# Patient Record
Sex: Female | Born: 1990 | Race: White | Hispanic: No | Marital: Single | Smoking: Never smoker
Health system: Southern US, Community
[De-identification: ages and names within clinical notes are randomized; demographics above are authoritative.]

## PROBLEM LIST (undated history)

## (undated) DIAGNOSIS — T7840XA Allergy, unspecified, initial encounter: Secondary | ICD-10-CM

## (undated) HISTORY — DX: Allergy, unspecified, initial encounter: T78.40XA

---

## 2015-02-09 ENCOUNTER — Ambulatory Visit (INDEPENDENT_AMBULATORY_CARE_PROVIDER_SITE_OTHER): Payer: 59 | Admitting: Family Medicine

## 2015-02-09 ENCOUNTER — Encounter: Payer: Self-pay | Admitting: Family Medicine

## 2015-02-09 VITALS — BP 114/96 | HR 97 | Temp 98.2°F | Resp 17 | Ht 63.5 in | Wt 281.6 lb

## 2015-02-09 DIAGNOSIS — E669 Obesity, unspecified: Secondary | ICD-10-CM

## 2015-02-09 DIAGNOSIS — A5901 Trichomonal vulvovaginitis: Secondary | ICD-10-CM | POA: Diagnosis not present

## 2015-02-09 DIAGNOSIS — N926 Irregular menstruation, unspecified: Secondary | ICD-10-CM | POA: Diagnosis not present

## 2015-02-09 DIAGNOSIS — E282 Polycystic ovarian syndrome: Secondary | ICD-10-CM | POA: Diagnosis not present

## 2015-02-09 DIAGNOSIS — Z30011 Encounter for initial prescription of contraceptive pills: Secondary | ICD-10-CM | POA: Diagnosis not present

## 2015-02-09 DIAGNOSIS — Z130 Encounter for screening for diseases of the blood and blood-forming organs and certain disorders involving the immune mechanism: Secondary | ICD-10-CM

## 2015-02-09 DIAGNOSIS — Z113 Encounter for screening for infections with a predominantly sexual mode of transmission: Secondary | ICD-10-CM

## 2015-02-09 DIAGNOSIS — Z124 Encounter for screening for malignant neoplasm of cervix: Secondary | ICD-10-CM | POA: Diagnosis not present

## 2015-02-09 DIAGNOSIS — Z13220 Encounter for screening for lipoid disorders: Secondary | ICD-10-CM

## 2015-02-09 DIAGNOSIS — J302 Other seasonal allergic rhinitis: Secondary | ICD-10-CM | POA: Diagnosis not present

## 2015-02-09 DIAGNOSIS — Z131 Encounter for screening for diabetes mellitus: Secondary | ICD-10-CM | POA: Diagnosis not present

## 2015-02-09 DIAGNOSIS — N915 Oligomenorrhea, unspecified: Secondary | ICD-10-CM

## 2015-02-09 LAB — POCT URINE PREGNANCY: PREG TEST UR: NEGATIVE

## 2015-02-09 MED ORDER — NORETHINDRONE 0.35 MG PO TABS: 1.0000 | ORAL_TABLET | Freq: Every day | ORAL | Status: AC

## 2015-02-09 MED ORDER — NORETHINDRONE 0.35 MG PO TABS: 1.0000 | ORAL_TABLET | Freq: Every day | ORAL | Status: DC

## 2015-02-09 MED ORDER — FLUTICASONE PROPIONATE 50 MCG/ACT NA SUSP: 2.0000 | Freq: Every day | NASAL | Status: DC

## 2015-02-09 MED ORDER — FLUTICASONE PROPIONATE 50 MCG/ACT NA SUSP: 2.0000 | Freq: Every day | NASAL | Status: AC

## 2015-02-09 NOTE — Patient Instructions (Addendum)
I will be in touch with your labs asap I do suspect that you have PCOS.  This can make it harder (but certainly not impossible!) to become pregnant Weight loss is your best tool to fight PCOS.  I would recommend that you work on gradual weight loss by increasing your activity level and by cutting back somewhat on portion sizes.  Weight training is key to building muscle mass and burning more calories (your might try the Body Pump and other classes offered through [employer] for free).  Weight watchers can also help.   Since you are not ready to have a baby right now we will start you on birth control pills.  We will start you on a progesterone only pill- take it once a day.   Take a home pregnancy test in about 2 weeks; assuming negative continue your pill  I agree that you are having sx of allergies.   I send in an rx for a nasal steroid spray. Your might also try an OTC antihistamine such as claritin or zyrtec.    It was nice to meet you today!  I will look for your labs when you have them draw [facility]- sorry that we were not able to get these today.

## 2015-02-09 NOTE — Progress Notes (Signed)
[facility] [address] [place] [address] [phone]  Date:  02/09/2015   Name:  Samantha Todd   DOB:  02-Aug-1990   MRN:  [id number]  PCP:  No primary care provider on file.    Chief Complaint: STD Testing; runny nose; and eyes runny   History of Present Illness:  Samantha Todd is a 24 y.o. very pleasant female patient who presents with the following:  Here today as a new patient with concern of needing STI screening, irregular menses and ?allergies Also she has noted possible allergy sx- in the morning she will have a runny nose, runny eyes.  Some sneezing- not too much. Not coughing She can have some itching of her nose and eyes This has bothered her for 3 weeks or so.  She tried an OTC nasal spray but it did not seem to help her too much Her last pap was about 2 years ago.    She and her BF are having unprotected sex- have done so for 2 years.  She has not gotten pregnant and wonders why.  Her mother did have PCOS and she wonders if she might have the same  [name] states that her menses have become irregular over the last 3 years; can occur "whenever," may last 3-4 days.   Not a smoker No history of DVT/ PE No cancer history No known history of HTN She does not get migraine HA.    She is a Water quality scientist at [employer].    There are no active problems to display for this patient.   Past Medical History  Diagnosis Date  . Allergy     History reviewed. No pertinent past surgical history.  Social History  Substance Use Topics  . Smoking status: Never Smoker   . Smokeless tobacco: None  . Alcohol Use: No    Family History  Problem Relation Age of Onset  . Hypertension Father   . Hyperlipidemia Maternal Grandmother   . Hypertension Maternal Grandmother   . Heart disease Maternal Grandfather   . Hypertension Paternal Grandmother   . Cancer Paternal Grandfather     Lung    No Known Allergies  Medication list has been reviewed  and updated.  No current outpatient prescriptions on file prior to visit.   No current facility-administered medications on file prior to visit.    Review of Systems:  As per HPI- otherwise negative.   Physical Examination: Filed Vitals:   02/09/15 1543  BP: 124/96  Pulse: 97  Temp: 98.2 F (36.8 C)  Resp: 17   Filed Vitals:   02/09/15 1543  Height: 5' 3.5" (1.613 m)  Weight: 281 lb 9.6 oz (127.733 kg)   Body mass index is 49.09 kg/(m^2). Ideal Body Weight: Weight in (lb) to have BMI = 25: 143.1  GEN: WDWN, NAD, Non-toxic, A & O x 3, obese, looks well HEENT: Atraumatic, Normocephalic. Neck supple. No masses, No LAD. Ears and Nose: No external deformity. CV: RRR, No M/G/R. No JVD. No thrill. No extra heart sounds. PULM: CTA B, no wheezes, crackles, rhonchi. No retractions. No resp. distress. No accessory muscle use. ABD: S, NT, ND EXTR: No c/c/e NEURO Normal gait.  PSYCH: Normally interactive. Conversant. Not depressed or anxious appearing.  Calm demeanor.  Pelvic: normal, no vaginal lesions or discharge. Uterus normal, no CMT, no adnexal tendereness or masses   Results for orders placed or performed in visit on 02/09/15  POCT urine pregnancy  Result Value Ref  Range   Preg Test, Ur Negative Negative    Assessment and Plan: Screening for cervical cancer - Plan: Pap IG, CT/NG w/ reflex HPV when ASC-U  Routine screening for STI (sexually transmitted infection) - Plan: Hepatitis B surface antibody, Hepatitis B surface antigen, Hepatitis C antibody, HIV antibody, RPR, CANCELED: Hepatitis B surface antibody, CANCELED: Hepatitis B surface antigen, CANCELED: Hepatitis C antibody, CANCELED: HIV antibody, CANCELED: RPR  Screening for hyperlipidemia - Plan: Lipid panel, CANCELED: LDL cholesterol, direct  Screening for diabetes mellitus - Plan: Comprehensive metabolic panel, Hemoglobin A1c, CANCELED: Comprehensive metabolic panel, CANCELED: Hemoglobin A1c  Obesity - Plan:  TSH, CANCELED: Hemoglobin A1c, CANCELED: LDL cholesterol, direct  PCOS (polycystic ovarian syndrome)  Irregular menses - Plan: POCT urine pregnancy, TSH, CANCELED: TSH  Screening for deficiency anemia - Plan: CBC, CANCELED: CBC  Other seasonal allergic rhinitis - Plan: fluticasone (FLONASE) 50 MCG/ACT nasal spray, DISCONTINUED: fluticasone (FLONASE) 50 MCG/ACT nasal spray  Encounter for initial prescription of contraceptive pills - Plan: norethindrone (ORTHO MICRONOR) 0.35 MG tablet, DISCONTINUED: norethindrone (ORTHO MICRONOR) 0.35 MG tablet  We were not able to get her blood today- ordered all BW as future orders so she can hopefully get these done at [facility] where [facility] works soon See patient instructions for more details and discussion.     Signed Abbe AmsterdamJessica Maruice Pieroni, MD

## 2015-02-10 DIAGNOSIS — E282 Polycystic ovarian syndrome: Secondary | ICD-10-CM | POA: Insufficient documentation

## 2015-02-10 DIAGNOSIS — E669 Obesity, unspecified: Secondary | ICD-10-CM | POA: Insufficient documentation

## 2015-02-11 LAB — PAP IG, CT-NG, RFX HPV ASCU
CHLAMYDIA PROBE AMP: NEGATIVE
GC PROBE AMP: NEGATIVE

## 2015-02-11 MED ORDER — METRONIDAZOLE 500 MG PO TABS: 500.0000 mg | ORAL_TABLET | Freq: Two times a day (BID) | ORAL | Status: DC

## 2015-02-11 NOTE — Addendum Note (Signed)
Addended by: Abbe AmsterdamOPLAND, JESSICA C on: 02/11/2015 08:14 PM   Modules accepted: Orders

## 2015-02-12 ENCOUNTER — Other Ambulatory Visit (INDEPENDENT_AMBULATORY_CARE_PROVIDER_SITE_OTHER): Payer: 59 | Admitting: *Deleted

## 2015-02-12 ENCOUNTER — Other Ambulatory Visit: Payer: Self-pay | Admitting: Family Medicine

## 2015-02-12 DIAGNOSIS — Z131 Encounter for screening for diabetes mellitus: Secondary | ICD-10-CM

## 2015-02-12 DIAGNOSIS — N926 Irregular menstruation, unspecified: Secondary | ICD-10-CM

## 2015-02-12 DIAGNOSIS — Z13220 Encounter for screening for lipoid disorders: Secondary | ICD-10-CM

## 2015-02-12 DIAGNOSIS — Z130 Encounter for screening for diseases of the blood and blood-forming organs and certain disorders involving the immune mechanism: Secondary | ICD-10-CM | POA: Diagnosis not present

## 2015-02-12 DIAGNOSIS — E669 Obesity, unspecified: Secondary | ICD-10-CM

## 2015-02-12 DIAGNOSIS — N915 Oligomenorrhea, unspecified: Secondary | ICD-10-CM

## 2015-02-12 DIAGNOSIS — Z113 Encounter for screening for infections with a predominantly sexual mode of transmission: Secondary | ICD-10-CM

## 2015-02-12 DIAGNOSIS — A5901 Trichomonal vulvovaginitis: Secondary | ICD-10-CM

## 2015-02-12 LAB — COMPREHENSIVE METABOLIC PANEL
ALBUMIN: 4.1 g/dL (ref 3.6–5.1)
ALT: 12 U/L (ref 6–29)
AST: 12 U/L (ref 10–30)
Alkaline Phosphatase: 56 U/L (ref 33–115)
BILIRUBIN TOTAL: 0.4 mg/dL (ref 0.2–1.2)
BUN: 11 mg/dL (ref 7–25)
CALCIUM: 8.9 mg/dL (ref 8.6–10.2)
CHLORIDE: 103 mmol/L (ref 98–110)
CO2: 28 mmol/L (ref 20–31)
Creat: 0.82 mg/dL (ref 0.50–1.10)
Glucose, Bld: 104 mg/dL — ABNORMAL HIGH (ref 65–99)
POTASSIUM: 4.1 mmol/L (ref 3.5–5.3)
SODIUM: 139 mmol/L (ref 135–146)
Total Protein: 7.6 g/dL (ref 6.1–8.1)

## 2015-02-12 LAB — HEMOGLOBIN A1C
HEMOGLOBIN A1C: 5.5 % (ref <5.7)
Mean Plasma Glucose: 111 mg/dL (ref <117)

## 2015-02-12 LAB — LIPID PANEL
CHOL/HDL RATIO: 3.5 ratio (ref <=5.0)
Cholesterol: 129 mg/dL (ref 125–200)
HDL: 37 mg/dL — AB (ref >=46)
LDL CALC: 76 mg/dL (ref <130)
Triglycerides: 78 mg/dL (ref <150)
VLDL: 16 mg/dL (ref <30)

## 2015-02-12 LAB — HIV ANTIBODY (ROUTINE TESTING W REFLEX): HIV 1&2 Ab, 4th Generation: NONREACTIVE

## 2015-02-12 LAB — CBC
HCT: 39.9 % (ref 36.0–46.0)
Hemoglobin: 13 g/dL (ref 12.0–15.0)
MCH: 26.9 pg (ref 26.0–34.0)
MCHC: 32.6 g/dL (ref 30.0–36.0)
MCV: 82.6 fL (ref 78.0–100.0)
MPV: 8.7 fL (ref 8.6–12.4)
PLATELETS: 371 10*3/uL (ref 150–400)
RBC: 4.83 MIL/uL (ref 3.87–5.11)
RDW: 12.6 % (ref 11.5–15.5)
WBC: 8.2 10*3/uL (ref 4.0–10.5)

## 2015-02-12 LAB — TSH: TSH: 1.256 u[IU]/mL (ref 0.350–4.500)

## 2015-02-12 MED ORDER — METRONIDAZOLE 500 MG PO TABS: 500.0000 mg | ORAL_TABLET | Freq: Two times a day (BID) | ORAL | Status: AC

## 2015-02-12 NOTE — Addendum Note (Signed)
Addended by: Abbe AmsterdamOPLAND, JESSICA C on: 02/12/2015 08:05 AM   Modules accepted: Orders

## 2015-02-13 ENCOUNTER — Encounter: Payer: Self-pay | Admitting: Family Medicine

## 2015-02-13 DIAGNOSIS — Z789 Other specified health status: Secondary | ICD-10-CM | POA: Insufficient documentation

## 2015-02-13 LAB — HEPATITIS C ANTIBODY: HCV AB: NEGATIVE

## 2015-02-13 LAB — HEPATITIS B SURFACE ANTIBODY, QUANTITATIVE: Hepatitis B-Post: 36.6 m[IU]/mL

## 2015-02-13 LAB — RPR

## 2015-02-13 LAB — HEPATITIS B SURFACE ANTIGEN: HEP B S AG: NEGATIVE

## 2015-02-14 ENCOUNTER — Other Ambulatory Visit: Payer: Self-pay | Admitting: Family Medicine

## 2015-02-14 DIAGNOSIS — N915 Oligomenorrhea, unspecified: Secondary | ICD-10-CM

## 2015-02-18 ENCOUNTER — Other Ambulatory Visit: Payer: Self-pay

## 2015-02-21 ENCOUNTER — Ambulatory Visit
Admission: RE | Admit: 2015-02-21 | Discharge: 2015-02-21 | Disposition: A | Discharge status: Home / Self Care | Payer: 59 | Source: Ambulatory Visit | Attending: Family Medicine | Admitting: Family Medicine

## 2015-02-21 ENCOUNTER — Ambulatory Visit
Admission: RE | Admit: 2015-02-21 | Discharge: 2015-02-21 | Disposition: A | Discharge status: Home / Self Care | Payer: Self-pay | Source: Ambulatory Visit | Attending: Family Medicine | Admitting: Family Medicine

## 2015-02-21 DIAGNOSIS — N915 Oligomenorrhea, unspecified: Secondary | ICD-10-CM

## 2016-07-20 IMAGING — US US PELVIS COMPLETE
1 series · 14 of 25 positions shown · non-contrast
Comparison: None

CLINICAL DATA: Irregular cycles, infertility. Evaluate for
polycystic ovarian syndrome.

EXAM:
TRANSABDOMINAL AND TRANSVAGINAL ULTRASOUND OF PELVIS
TECHNIQUE: Both transabdominal and transvaginal ultrasound examinations of the
pelvis were performed. Transabdominal technique was performed for
global imaging of the pelvis including uterus, ovaries, adnexal
regions, and pelvic cul-de-sac. It was necessary to proceed with
endovaginal exam following the transabdominal exam to visualize the
uterus, endometrium, ovaries and adnexa .

[Series 1: us pelvis complete · 0.30mm/px · 14 of 58 slices shown]
[im 1/58]
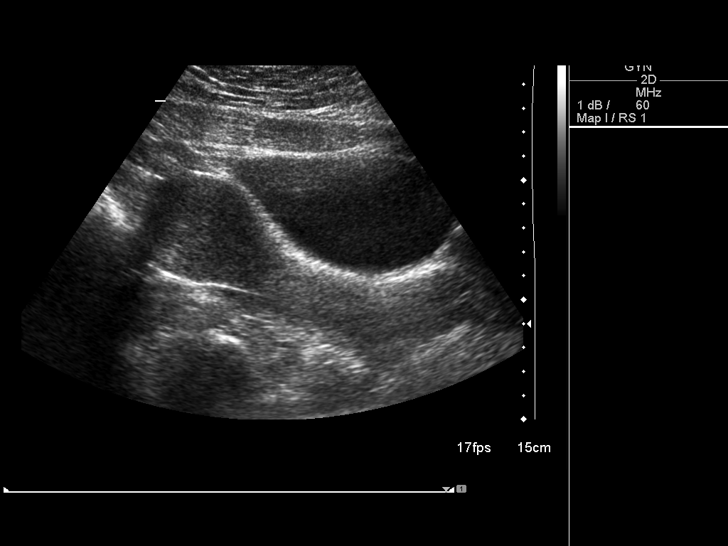
[im 5/58]
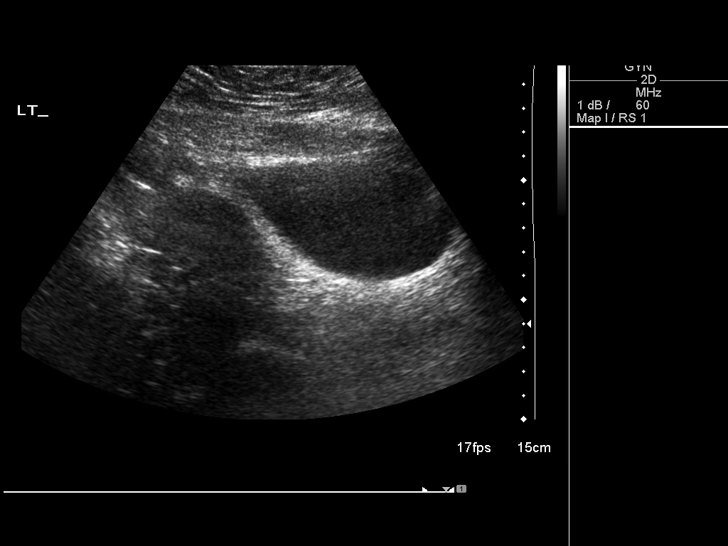
[im 10/58]
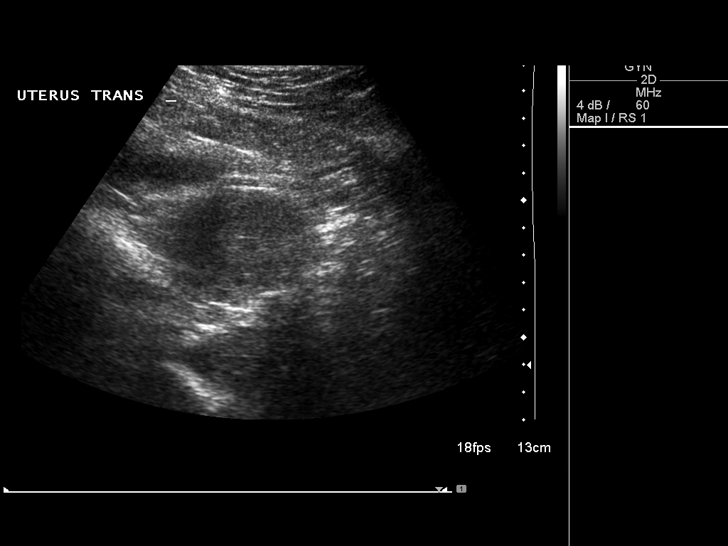
[im 15/58]
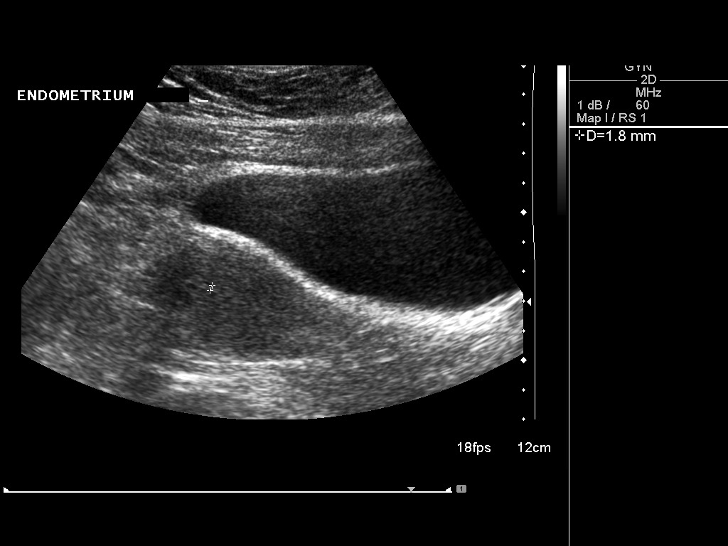
[im 20/58]
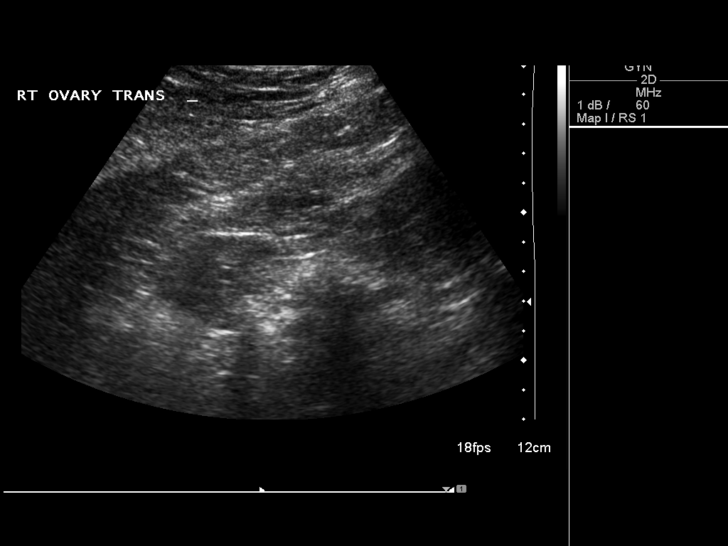
[im 22/58]
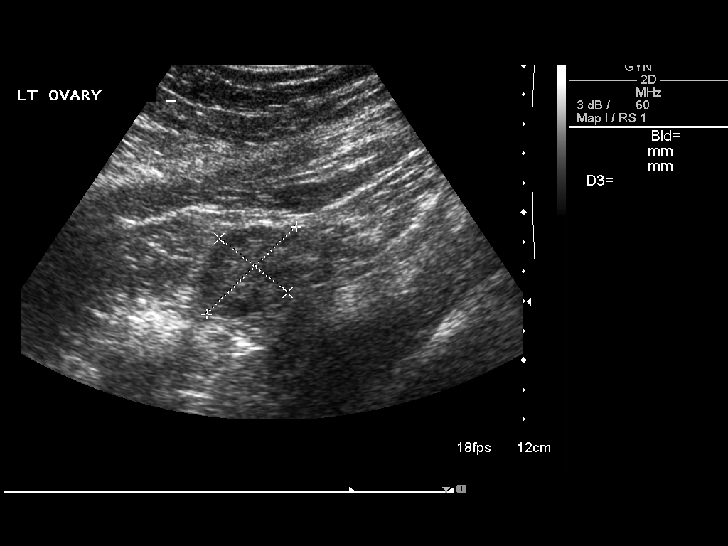
[im 27/58]
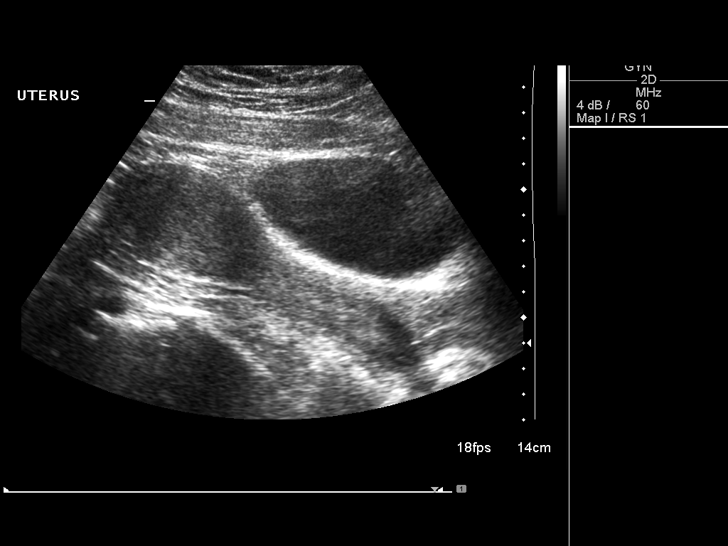
[im 31/58]
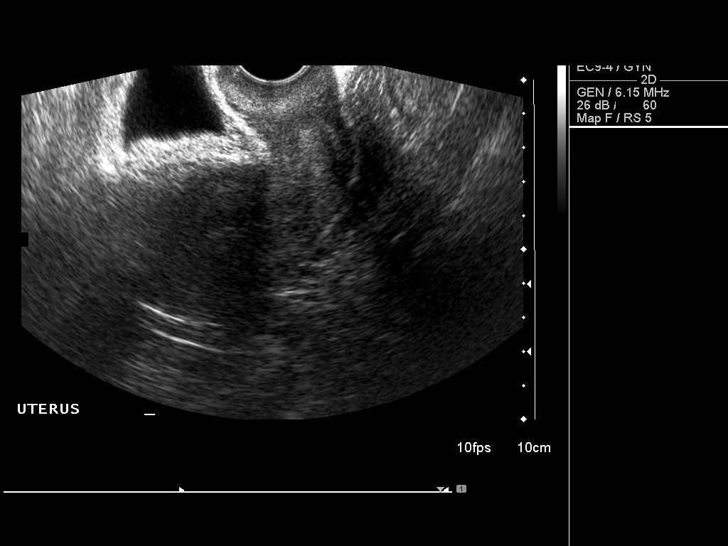
[im 36/58]
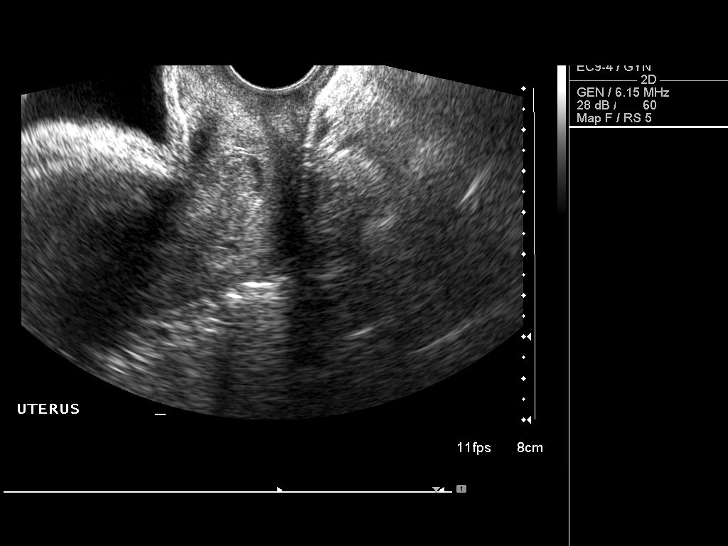
[im 39/58]
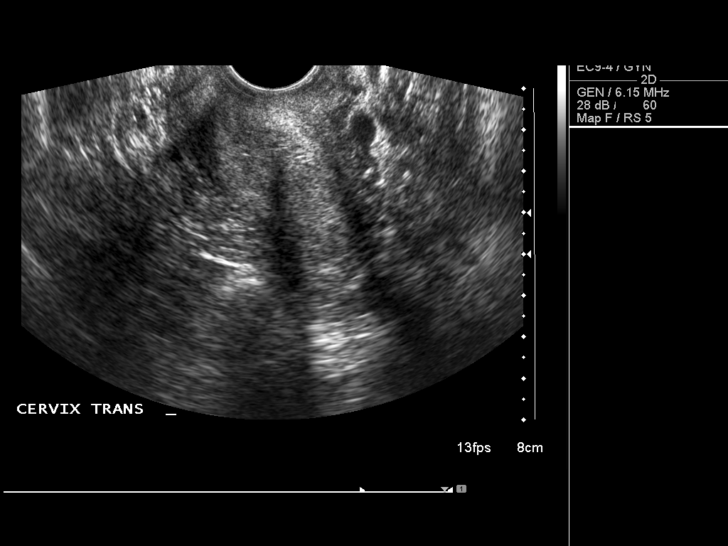
[im 43/58]
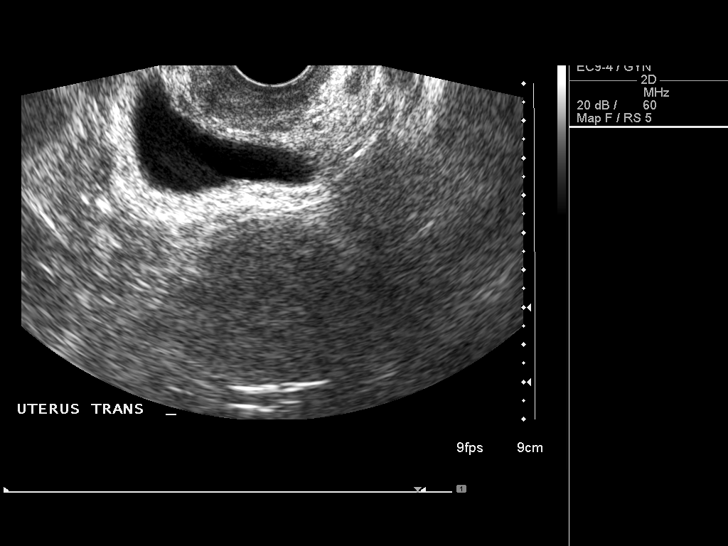
[im 48/58]
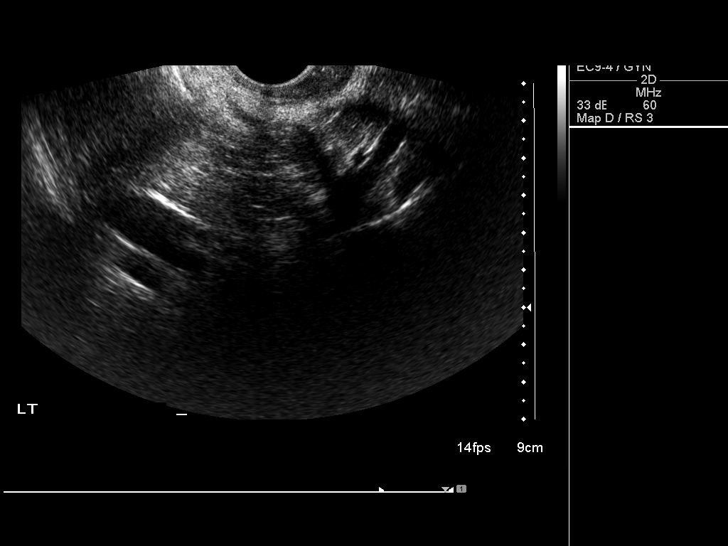
[im 53/58]
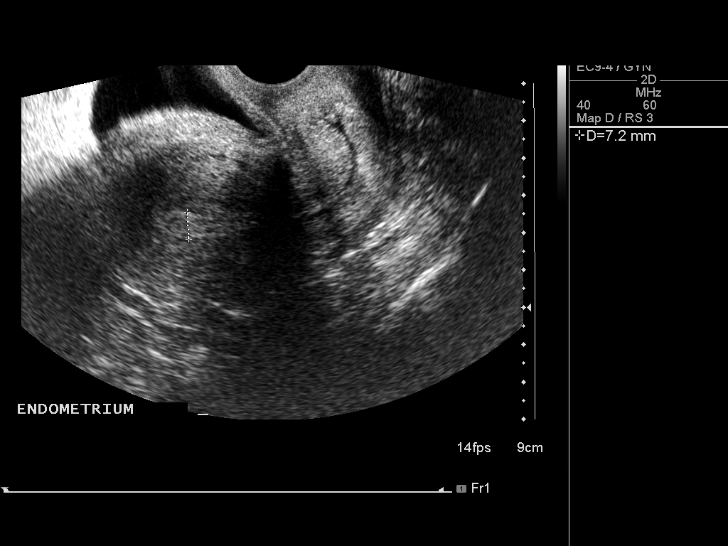
[im 58/58]
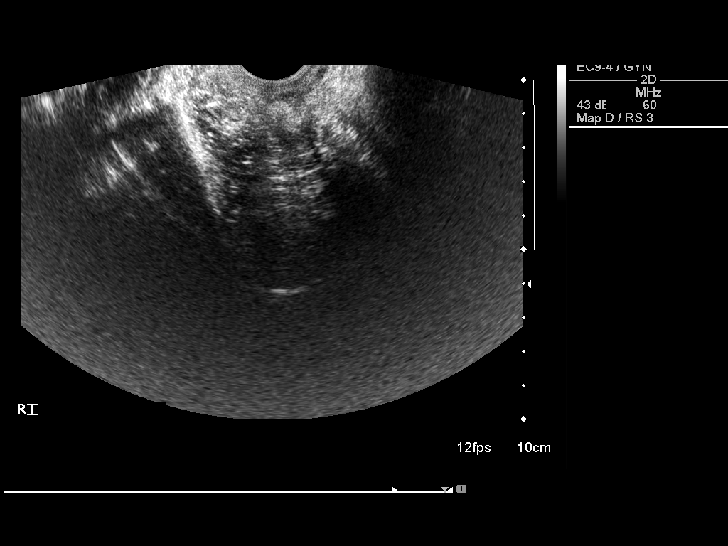

[14 of 25 positions shown; findings below may reference images not displayed]

FINDINGS: Uterus

Measurements: 13.0 x 4.7 x 5.2 cm. No fibroids or other mass
visualized.

Endometrium

Thickness: 4 mm in thickness.  No focal abnormality visualized.

Right ovary

Measurements: 3.4 x 3.0 x 3.9 cm. Normal appearance/no adnexal mass.
Few tiny follicles peripherally.

Left ovary

Measurements: 4.2 x 3.0 x 3.2 cm. Normal appearance/no adnexal mass.
Few tiny follicles peripherally.

Other findings

No free fluid.
IMPRESSION: Ovaries are somewhat difficult to visualize due to their lateral
position. There are a few tiny peripheral follicles in both ovaries.
